# Patient Record
Sex: Male | Born: 1965 | Race: White | Hispanic: No | Marital: Married | Smoking: Former smoker
Health system: Southern US, Community
[De-identification: ages and names within clinical notes are randomized; demographics above are authoritative.]

## PROBLEM LIST (undated history)

## (undated) DIAGNOSIS — I10 Essential (primary) hypertension: Secondary | ICD-10-CM

---

## 2004-02-21 ENCOUNTER — Ambulatory Visit (HOSPITAL_COMMUNITY): Admission: RE | Admit: 2004-02-21 | Discharge: 2004-02-21 | Payer: Self-pay | Admitting: Family Medicine

## 2004-02-23 ENCOUNTER — Ambulatory Visit (HOSPITAL_COMMUNITY): Admission: RE | Admit: 2004-02-23 | Discharge: 2004-02-23 | Payer: Self-pay | Admitting: Family Medicine

## 2004-04-26 ENCOUNTER — Encounter: Admission: RE | Admit: 2004-04-26 | Discharge: 2004-04-26 | Payer: Self-pay | Admitting: Neurosurgery

## 2004-05-10 ENCOUNTER — Encounter: Admission: RE | Admit: 2004-05-10 | Discharge: 2004-05-10 | Payer: Self-pay | Admitting: Neurosurgery

## 2004-05-24 ENCOUNTER — Encounter: Admission: RE | Admit: 2004-05-24 | Discharge: 2004-05-24 | Payer: Self-pay | Admitting: Neurosurgery

## 2004-10-25 IMAGING — CR DG CHEST 2V
2 series · 2 of 2 positions shown · non-contrast
Comparison: none

CLINICAL DATA: Herniated nucleus pulposus.  Pre-admission for surgery [DATE].  Nonsmoker.  No chest complaints. 
 2-VIEW CHEST:
 PA and lateral views of the chest are made and show the heart, lungs, bony thorax, and soft tissues to be within the normal limits.

[view not recorded (1 of 2)]
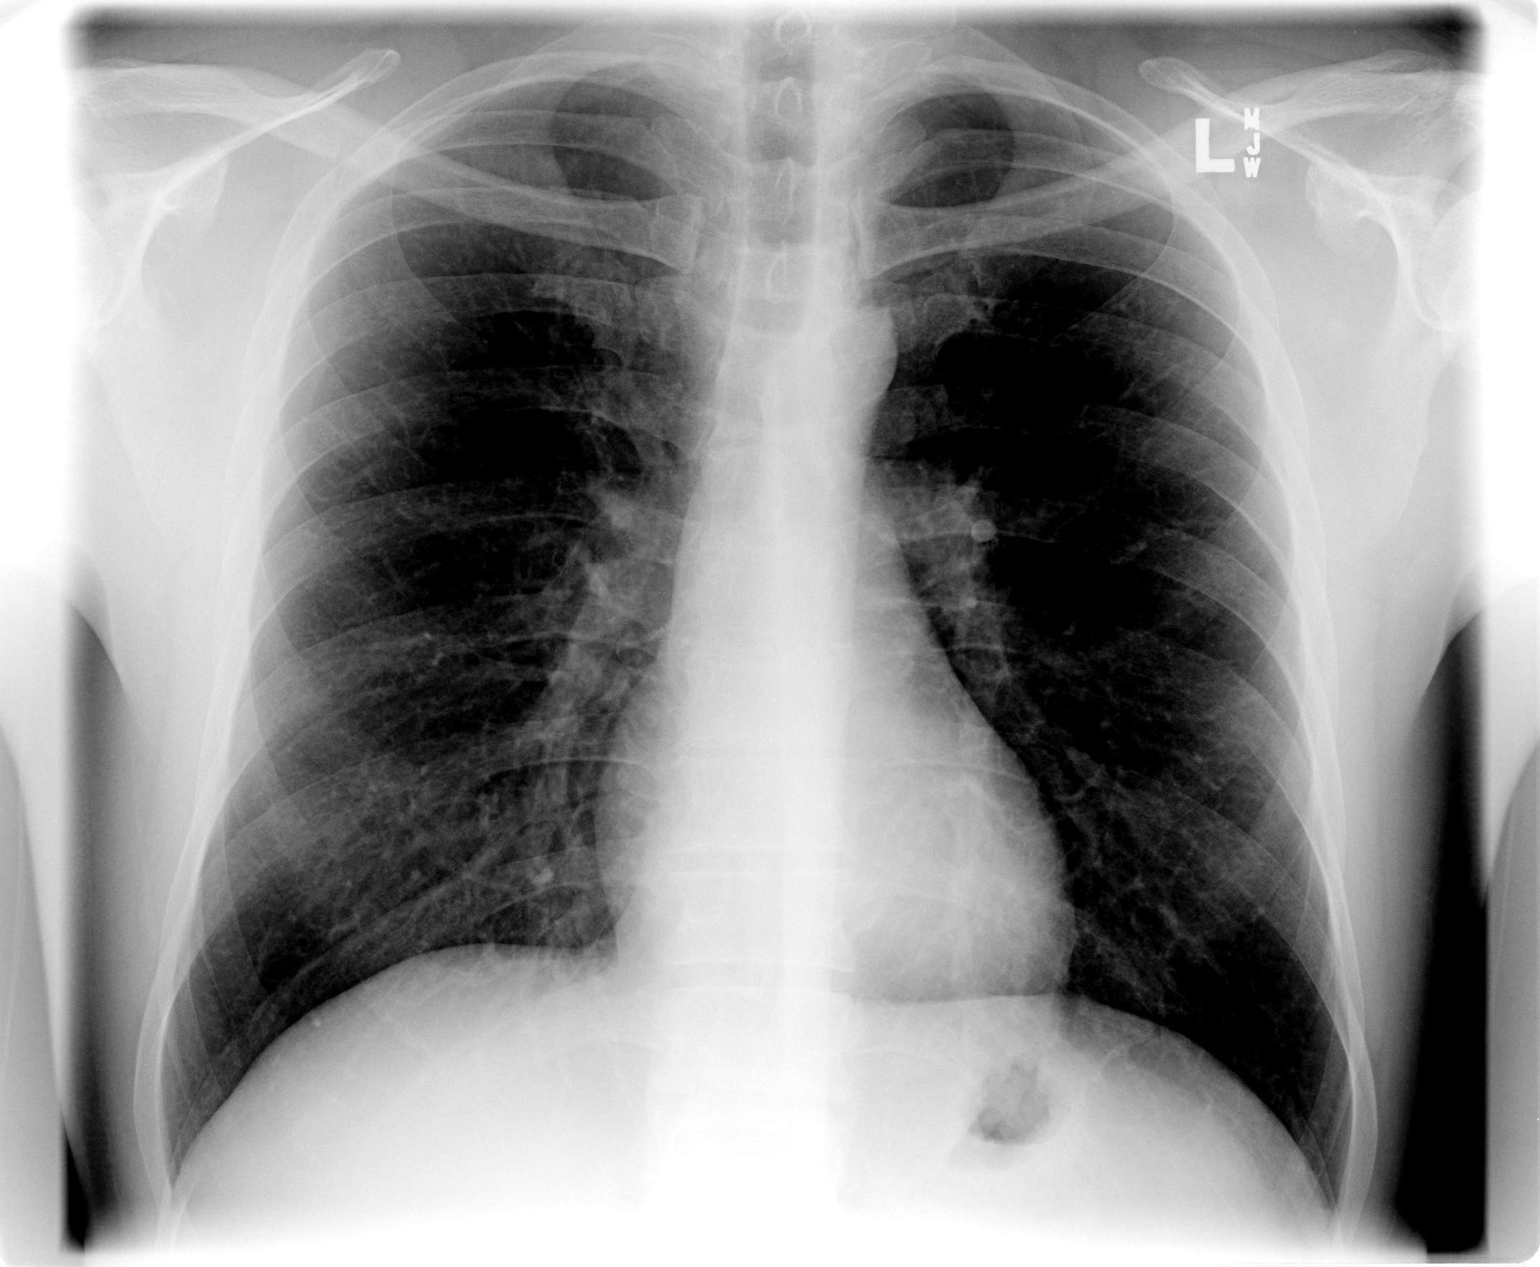

[view not recorded (2 of 2)]
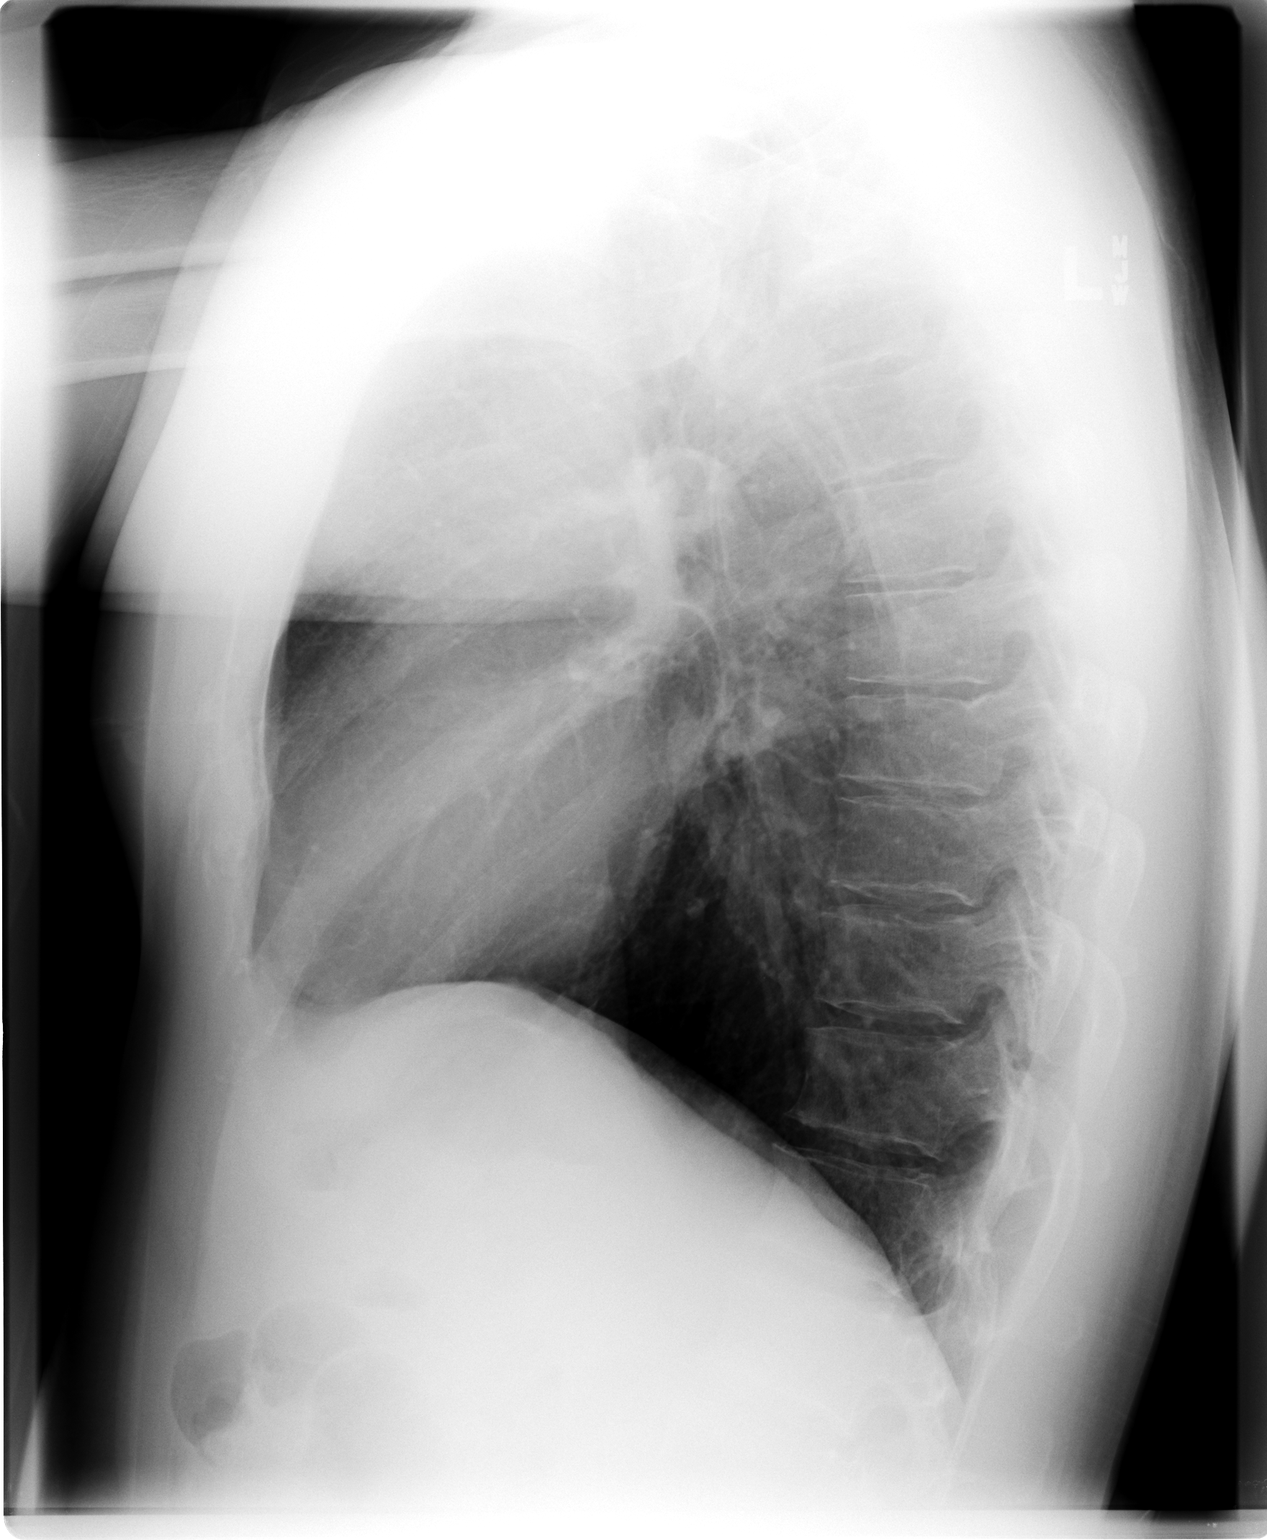

[2 of 2 positions shown; findings below may reference images not displayed]

IMPRESSION: No evidence of active cardiopulmonary disease.

## 2004-10-27 ENCOUNTER — Inpatient Hospital Stay (HOSPITAL_COMMUNITY): Admission: AD | Admit: 2004-10-27 | Discharge: 2004-10-29 | Payer: Self-pay | Admitting: Neurosurgery

## 2019-11-19 ENCOUNTER — Ambulatory Visit (INDEPENDENT_AMBULATORY_CARE_PROVIDER_SITE_OTHER): Payer: BC Managed Care – PPO | Admitting: Podiatry

## 2019-11-19 ENCOUNTER — Other Ambulatory Visit: Payer: Self-pay

## 2019-11-19 ENCOUNTER — Ambulatory Visit (INDEPENDENT_AMBULATORY_CARE_PROVIDER_SITE_OTHER): Payer: BC Managed Care – PPO

## 2019-11-19 ENCOUNTER — Encounter: Payer: Self-pay | Admitting: Podiatry

## 2019-11-19 ENCOUNTER — Other Ambulatory Visit: Payer: Self-pay | Admitting: Podiatry

## 2019-11-19 VITALS — BP 119/72 | HR 69 | Temp 97.3°F | Resp 16

## 2019-11-19 DIAGNOSIS — L600 Ingrowing nail: Secondary | ICD-10-CM

## 2019-11-19 DIAGNOSIS — M205X9 Other deformities of toe(s) (acquired), unspecified foot: Secondary | ICD-10-CM

## 2019-11-19 DIAGNOSIS — M79672 Pain in left foot: Secondary | ICD-10-CM | POA: Diagnosis not present

## 2019-11-19 DIAGNOSIS — M79671 Pain in right foot: Secondary | ICD-10-CM | POA: Diagnosis not present

## 2019-11-19 MED ORDER — NEOMYCIN-POLYMYXIN-HC 3.5-10000-1 OT SOLN: OTIC | 0 refills | Status: AC

## 2019-11-19 NOTE — Progress Notes (Signed)
   Subjective:    Patient ID: Raymond Perkins, male    DOB: 1966-03-16, 54 y.o.   MRN: [id number]  HPI    Review of Systems  All other systems reviewed and are negative.      Objective:   Physical Exam        Assessment & Plan:

## 2019-11-19 NOTE — Patient Instructions (Signed)

## 2019-11-19 NOTE — Progress Notes (Signed)
Subjective:   Patient ID: Raymond Perkins, male   DOB: 54 y.o.   MRN: [id number]   HPI Patient presents stating that the left hallux has been ingrown and sore and making it hard to wear shoe gear comfortably.  Patient also complains that the big toe joint at times she has pain and that he has noted that they have gotten larger.  Patient does not smoke likes to be active   Review of Systems  All other systems reviewed and are negative.       Objective:  Physical Exam Vitals and nursing note reviewed.  Constitutional:      Appearance: He is well-developed.  Pulmonary:     Effort: Pulmonary effort is normal.  Musculoskeletal:        General: Normal range of motion.  Skin:    General: Skin is warm.  Neurological:     Mental Status: He is alert.     Neurovascular status intact muscle strength found to be adequate range of motion within normal limits.  Patient does have limitation of motion first MPJ bilateral with enlargement around the joint surfaces and on the left hallux lateral border incurvated painful when pressed make shoe gear difficult with no redness or drainage noted.  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  I infiltrated the left hallux 60 mg Xylocaine Marcaine mixture sterile prep applied using sterile instrumentation remove the lateral border exposed matrix and applied phenol 3 applications 30 seconds followed by alcohol lavage and sterile dressing.  Give instructions on soaks and to leave dressing on 24 hours but take it off earlier as needed and prescription for drops written and patient is encouraged to call with questions concerns.  I  educated the patient on hallux limitus rigidus and we will defer treatment currently  X-rays indicate significant spurring first MPJ right over left with narrowing of the joint surface but appears that there is adequate space with a rounded joint

## 2020-08-23 ENCOUNTER — Other Ambulatory Visit: Payer: Self-pay

## 2020-08-23 ENCOUNTER — Ambulatory Visit (INDEPENDENT_AMBULATORY_CARE_PROVIDER_SITE_OTHER): Payer: BC Managed Care – PPO | Admitting: Otolaryngology

## 2020-08-23 ENCOUNTER — Encounter (INDEPENDENT_AMBULATORY_CARE_PROVIDER_SITE_OTHER): Payer: Self-pay | Admitting: Otolaryngology

## 2020-08-23 VITALS — Temp 97.7°F

## 2020-08-23 DIAGNOSIS — H9120 Sudden idiopathic hearing loss, unspecified ear: Secondary | ICD-10-CM

## 2020-08-23 DIAGNOSIS — H9042 Sensorineural hearing loss, unilateral, left ear, with unrestricted hearing on the contralateral side: Secondary | ICD-10-CM

## 2020-08-23 NOTE — Progress Notes (Signed)
HPI: Raymond Perkins is a 55 y.o. male who presents for evaluation of sudden left ear hearing loss.  Patient works as a Company secretary as well as has a history of shooting guns.  Yesterday he woke up with significant decreased hearing in the left ear.  This has improved some today.  He has an uncle of Raymond Perkins's and had a hearing test earlier today that showed downsloping SNHL in both ears with slightly worse hearing in the left ear compared to the right.  SRT on the left side was 30 dB.  SRT on the right side was 15 dB.  He had type A tympanograms bilaterally.  He has had longstanding tinnitus in both ears.  But has been worse on the left side since yesterday.  No past medical history on file. No past surgical history on file. Social History   Socioeconomic History  . Marital status: Married    Spouse name: Not on file  . Number of children: Not on file  . Years of education: Not on file  . Highest education level: Not on file  Occupational History  . Not on file  Tobacco Use  . Smoking status: Former Smoker    Types: Cigarettes    Start date: 1986    Quit date: 2005    Years since quitting: 17.0  . Smokeless tobacco: Former Neurosurgeon    Types: Snuff    Quit date: 2006  . Tobacco comment: Very light smoker  Substance and Sexual Activity  . Alcohol use: Not on file  . Drug use: Not on file  . Sexual activity: Not on file  Other Topics Concern  . Not on file  Social History Narrative  . Not on file   Social Determinants of Health   Financial Resource Strain: Not on file  Food Insecurity: Not on file  Transportation Needs: Not on file  Physical Activity: Not on file  Stress: Not on file  Social Connections: Not on file   No family history on file. No Known Allergies Prior to Admission medications   Medication Sig Start Date End Date Taking? Authorizing Provider  hydrochlorothiazide (HYDRODIURIL) 25 MG tablet Take 25 mg by mouth every morning. 08/28/19   [provider]   losartan (COZAAR) 100 MG tablet Take 100 mg by mouth daily. 08/28/19   [provider]  neomycin-polymyxin-hydrocortisone (CORTISPORIN) OTIC solution Apply 1-2 drops to toe after soaking twice a day 11/19/19   Regal, Kirstie Peri, DPM  UNABLE TO FIND CPAP Machine - every night    [provider]     Positive ROS: Otherwise negative  All other systems have been reviewed and were otherwise negative with the exception of those mentioned in the HPI and as above.  Physical Exam: Constitutional: Alert, well-appearing, no acute distress Ears: External ears without lesions or tenderness. Ear canals are clear bilaterally with intact, clear TMs bilaterally. Nasal: External nose without lesions. Clear nasal passages Oral: Lips and gums without lesions. Tongue and palate mucosa without lesions. Posterior oropharynx clear. Neck: No palpable adenopathy or masses Respiratory: Breathing comfortably  Skin: No facial/neck lesions or rash noted.  Reviewed the audiogram patient has slightly worse hearing on the pure tones in the left compared to the right but this was minimal difference.  Most pronounced at 2000 frequency with the left ear at 25 dB in the right ear at 10 dB.  Procedures  Assessment: Left ear sudden sensorineural hearing loss.  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  Plan: His hearing seems to have spontaneously improved over the past 24 hours.  I placed him on a Sterapred 10 mg 12-day Dosepak.  He will have repeat hearing in 2 weeks with Raymond Perkins.  Narda Bonds, MD

## 2020-08-24 ENCOUNTER — Encounter (INDEPENDENT_AMBULATORY_CARE_PROVIDER_SITE_OTHER): Payer: Self-pay

## 2021-07-26 DIAGNOSIS — G4733 Obstructive sleep apnea (adult) (pediatric): Secondary | ICD-10-CM | POA: Diagnosis not present

## 2021-07-28 DIAGNOSIS — G4733 Obstructive sleep apnea (adult) (pediatric): Secondary | ICD-10-CM | POA: Diagnosis not present

## 2021-08-26 DIAGNOSIS — G4733 Obstructive sleep apnea (adult) (pediatric): Secondary | ICD-10-CM | POA: Diagnosis not present

## 2021-09-23 DIAGNOSIS — G4733 Obstructive sleep apnea (adult) (pediatric): Secondary | ICD-10-CM | POA: Diagnosis not present

## 2021-10-24 DIAGNOSIS — G4733 Obstructive sleep apnea (adult) (pediatric): Secondary | ICD-10-CM | POA: Diagnosis not present

## 2021-10-26 DIAGNOSIS — G4733 Obstructive sleep apnea (adult) (pediatric): Secondary | ICD-10-CM | POA: Diagnosis not present

## 2021-11-23 DIAGNOSIS — G4733 Obstructive sleep apnea (adult) (pediatric): Secondary | ICD-10-CM | POA: Diagnosis not present

## 2021-12-24 DIAGNOSIS — G4733 Obstructive sleep apnea (adult) (pediatric): Secondary | ICD-10-CM | POA: Diagnosis not present

## 2022-01-22 DIAGNOSIS — G4733 Obstructive sleep apnea (adult) (pediatric): Secondary | ICD-10-CM | POA: Diagnosis not present

## 2022-01-25 DIAGNOSIS — G4733 Obstructive sleep apnea (adult) (pediatric): Secondary | ICD-10-CM | POA: Diagnosis not present

## 2022-02-22 DIAGNOSIS — G4733 Obstructive sleep apnea (adult) (pediatric): Secondary | ICD-10-CM | POA: Diagnosis not present

## 2022-02-27 DIAGNOSIS — H40013 Open angle with borderline findings, low risk, bilateral: Secondary | ICD-10-CM | POA: Diagnosis not present

## 2022-02-28 ENCOUNTER — Ambulatory Visit
Admission: EM | Admit: 2022-02-28 | Discharge: 2022-02-28 | Disposition: A | Discharge status: Home / Self Care | Payer: BC Managed Care – PPO

## 2022-02-28 ENCOUNTER — Ambulatory Visit: Payer: Self-pay

## 2022-02-28 DIAGNOSIS — T148XXA Other injury of unspecified body region, initial encounter: Secondary | ICD-10-CM

## 2022-02-28 DIAGNOSIS — M546 Pain in thoracic spine: Secondary | ICD-10-CM

## 2022-02-28 HISTORY — DX: Essential (primary) hypertension: I10

## 2022-02-28 MED ORDER — METHOCARBAMOL 500 MG PO TABS: 500.0000 mg | ORAL_TABLET | Freq: Two times a day (BID) | ORAL | 0 refills | Status: AC | PRN

## 2022-02-28 NOTE — Discharge Instructions (Signed)
It appears that you have a muscle strain.  Please continue Advil and Tylenol if necessary.  I have prescribed you a muscle relaxer to help alleviate discomfort as well.  Please be advised that muscle relaxer can cause drowsiness so do not drive, operate machinery, drink alcohol while taking this medication.  Also recommend applying ice.  Follow-up with orthopedist at provided contact information if pain persists or worsens.

## 2022-02-28 NOTE — ED Triage Notes (Signed)
Pt c/o feeling like there is a "hitch" in his right shoulder, or a "catch." States started last night and awoken him around midnight last night. Has gotten worse and the cramps are limiting shoulder function.   States alternating advil and tylenol q2h.

## 2022-02-28 NOTE — ED Provider Notes (Signed)
[name]    CSN: [id number] Arrival date & time: 02/28/22  0805      History   Chief Complaint Chief Complaint  Patient presents with   Shoulder Problem    HPI Raymond Perkins is a 56 y.o. male.   Patient presents with right upper back pain that started last night around midnight.  Patient reports that pain is present in the right upper back and radiates to right shoulder at times.  Patient denies any apparent injury or denies any history of chronic pain.  He denies that he has been doing any heavy lifting lately as well.  Patient has been alternating Tylenol and Advil with improvement in pain and states that pain has pretty much resolved at this point.  He states that he wants to be evaluated given that it was severe last night and this has never occurred before.  He has also been applying ice to the area.     Past Medical History:  Diagnosis Date   Hypertension     There are no problems to display for this patient.   History reviewed. No pertinent surgical history.     Home Medications    Prior to Admission medications   Medication Sig Start Date End Date Taking? Authorizing Provider  methocarbamol (ROBAXIN) 500 MG tablet Take 1 tablet (500 mg total) by mouth 2 (two) times daily as needed for muscle spasms. 02/28/22  Yes Velera Lansdale, Rolly Salter E, FNP  hydrochlorothiazide (HYDRODIURIL) 25 MG tablet Take 25 mg by mouth every morning. 08/28/19   [provider]  losartan (COZAAR) 100 MG tablet Take 100 mg by mouth daily. 08/28/19   [provider]  neomycin-polymyxin-hydrocortisone (CORTISPORIN) OTIC solution Apply 1-2 drops to toe after soaking twice a day 11/19/19   Lenn Sink, DPM  omeprazole (PRILOSEC) 40 MG capsule 1 capsule 30 minutes before morning meal    [provider]  UNABLE TO FIND CPAP Machine - every night    [provider]    Family History History reviewed. No pertinent family history.  Social  History Social History   Tobacco Use   Smoking status: Former    Types: Cigarettes    Start date: 1986    Quit date: 2005    Years since quitting: 18.6   Smokeless tobacco: Former    Types: Snuff    Quit date: 2006   Tobacco comments:    Very light smoker     Allergies   Patient has no known allergies.   Review of Systems Review of Systems Per HPI  Physical Exam Triage Vital Signs ED Triage Vitals  Enc Vitals Group     BP 02/28/22 0816 (!) 150/85     Pulse Rate 02/28/22 0816 67     Resp 02/28/22 0816 18     Temp 02/28/22 0816 98 F (36.7 C)     Temp Source 02/28/22 0816 Oral     SpO2 02/28/22 0816 98 %     Weight --      Height --      Head Circumference --      Peak Flow --      Pain Score 02/28/22 0817 0     Pain Loc --      Pain Edu? --      Excl. in [place]? --    No data found.  Updated Vital Signs BP (!) 150/85 (BP Location: Left Arm)   Pulse 67   Temp 98 F (36.7  C) (Oral)   Resp 18   SpO2 98%   Visual Acuity Right Eye Distance:   Left Eye Distance:   Bilateral Distance:    Right Eye Near:   Left Eye Near:    Bilateral Near:     Physical Exam Constitutional:      General: He is not in acute distress.    Appearance: Normal appearance. He is not toxic-appearing or diaphoretic.  HENT:     Head: Normocephalic and atraumatic.  Eyes:     Extraocular Movements: Extraocular movements intact.     Conjunctiva/sclera: Conjunctivae normal.  Pulmonary:     Effort: Pulmonary effort is normal.  Musculoskeletal:       Back:     Comments: There is no tenderness to palpation on exam.  No obvious swelling, discoloration, warmth, lacerations, abrasions noted.  Patient reports tenderness occurred at circled area on diagram last night.  No tenderness to shoulder as well.  Patient has full range of motion of shoulder.  Grip strength 5/5.  No tenderness to neck.  No direct spinal tenderness, crepitus, step-off.  Neurological:     General: No focal deficit  present.     Mental Status: He is alert and oriented to person, place, and time. Mental status is at baseline.  Psychiatric:        Mood and Affect: Mood normal.        Behavior: Behavior normal.        Thought Content: Thought content normal.        Judgment: Judgment normal.      UC Treatments / Results  Labs (all labs ordered are listed, but only abnormal results are displayed) Labs Reviewed - No data to display  EKG   Radiology No results found.  Procedures Procedures (including critical care time)  Medications Ordered in UC Medications - No data to display  Initial Impression / Assessment and Plan / UC Course  I have reviewed the triage vital signs and the nursing notes.  Pertinent labs & imaging results that were available during my care of the patient were reviewed by me and considered in my medical decision making (see chart for details).     Physical exam and the patient's history are consistent with muscle strain. Suspect muscle strain of trapezius versus infraspinatus muscles.  Given the symptoms have been responsive to NSAIDs and Tylenol, patient was encouraged to continue this regimen.  Will add muscle relaxer as well.  Advised patient that muscle relaxer can cause drowsiness and do not drive, operate machinery, drink alcohol with taking. Patient voiced understanding.   Patient to apply ice to area as well.  Advised patient to follow-up with orthopedics at provided contact information if symptoms persist or worsen.  Do not think imaging is necessary given no obvious injury.  Discussed return precautions.  Patient verbalized understanding and was agreeable with plan. Final Clinical Impressions(s) / UC Diagnoses   Final diagnoses:  Muscle strain  Acute right-sided thoracic back pain     Discharge Instructions      It appears that you have a muscle strain.  Please continue Advil and Tylenol if necessary.  I have prescribed you a muscle relaxer to help  alleviate discomfort as well.  Please be advised that muscle relaxer can cause drowsiness so do not drive, operate machinery, drink alcohol while taking this medication.  Also recommend applying ice.  Follow-up with orthopedist at provided contact information if pain persists or worsens.    ED Prescriptions  Medication Sig Dispense Auth. Provider   methocarbamol (ROBAXIN) 500 MG tablet Take 1 tablet (500 mg total) by mouth 2 (two) times daily as needed for muscle spasms. 20 tablet [place], Acie Fredrickson, [place]      PDMP not reviewed this encounter.   Gustavus Bryant, [place] 02/28/22 [phone]

## 2022-03-06 DIAGNOSIS — Z125 Encounter for screening for malignant neoplasm of prostate: Secondary | ICD-10-CM | POA: Diagnosis not present

## 2022-03-06 DIAGNOSIS — I10 Essential (primary) hypertension: Secondary | ICD-10-CM | POA: Diagnosis not present

## 2022-03-06 DIAGNOSIS — Z23 Encounter for immunization: Secondary | ICD-10-CM | POA: Diagnosis not present

## 2022-03-06 DIAGNOSIS — Z0000 Encounter for general adult medical examination without abnormal findings: Secondary | ICD-10-CM | POA: Diagnosis not present

## 2022-03-25 DIAGNOSIS — G4733 Obstructive sleep apnea (adult) (pediatric): Secondary | ICD-10-CM | POA: Diagnosis not present

## 2022-04-23 DIAGNOSIS — G4733 Obstructive sleep apnea (adult) (pediatric): Secondary | ICD-10-CM | POA: Diagnosis not present

## 2022-04-27 DIAGNOSIS — G4733 Obstructive sleep apnea (adult) (pediatric): Secondary | ICD-10-CM | POA: Diagnosis not present

## 2022-05-09 ENCOUNTER — Ambulatory Visit
Admission: EM | Admit: 2022-05-09 | Discharge: 2022-05-09 | Disposition: A | Discharge status: Home / Self Care | Payer: BC Managed Care – PPO | Attending: Physician Assistant | Admitting: Physician Assistant

## 2022-05-09 DIAGNOSIS — L0291 Cutaneous abscess, unspecified: Secondary | ICD-10-CM | POA: Diagnosis not present

## 2022-05-09 MED ORDER — DOXYCYCLINE HYCLATE 100 MG PO CAPS: 100.0000 mg | ORAL_CAPSULE | Freq: Two times a day (BID) | ORAL | 0 refills | Status: AC

## 2022-05-09 NOTE — ED Triage Notes (Signed)
Pt c/o possible finger infection states a few weeks ago he thought there was an object in his finger so he got a needle and was "digging around" says he didn't find anything. Since then the area was healing until a couple days ago pts wife is concerned it is infection.

## 2022-05-09 NOTE — ED Provider Notes (Signed)
[name]    CSN: [id number] Arrival date & time: 05/09/22  0801      History   Chief Complaint Chief Complaint  Patient presents with   finger wound    HPI Raymond Perkins is a 56 y.o. male.   Patient here today for evaluation of possible infection to left index finger.  He reports that he had an area where he thought he had a foreign body, and he had tried to get this out however has now developed more swelling and tenderness to the finger.  Wife who is also a physician assistant recommended further evaluation as she was concerned for infection.  Patient denies any fever.  He has not had any nausea or vomiting.  The history is provided by the patient.    Past Medical History:  Diagnosis Date   Hypertension     There are no problems to display for this patient.   History reviewed. No pertinent surgical history.     Home Medications    Prior to Admission medications   Medication Sig Start Date End Date Taking? Authorizing Provider  doxycycline (VIBRAMYCIN) 100 MG capsule Take 1 capsule (100 mg total) by mouth 2 (two) times daily. 05/09/22  Yes Francene Finders, PA-C  hydrochlorothiazide (HYDRODIURIL) 25 MG tablet Take 25 mg by mouth every morning. 08/28/19   [provider]  losartan (COZAAR) 100 MG tablet Take 100 mg by mouth daily. 08/28/19   [provider]  methocarbamol (ROBAXIN) 500 MG tablet Take 1 tablet (500 mg total) by mouth 2 (two) times daily as needed for muscle spasms. 02/28/22   Teodora Medici, FNP  neomycin-polymyxin-hydrocortisone (CORTISPORIN) OTIC solution Apply 1-2 drops to toe after soaking twice a day 11/19/19   Wallene Huh, DPM  omeprazole (PRILOSEC) 40 MG capsule 1 capsule 30 minutes before morning meal    [provider]  UNABLE TO FIND CPAP Machine - every night    [provider]    Family History History reviewed. No pertinent family history.  Social History Social History    Tobacco Use   Smoking status: Former    Types: Cigarettes    Start date: 1986    Quit date: 2005    Years since quitting: 18.8   Smokeless tobacco: Former    Types: Snuff    Quit date: 2006   Tobacco comments:    Very light smoker     Allergies   Patient has no known allergies.   Review of Systems Review of Systems  Constitutional:  Negative for chills and fever.  Eyes:  Negative for discharge and redness.  Gastrointestinal:  Negative for nausea and vomiting.  Skin:  Positive for color change. Negative for wound.  Neurological:  Negative for numbness.     Physical Exam Triage Vital Signs ED Triage Vitals  Enc Vitals Group     BP      Pulse      Resp      Temp      Temp src      SpO2      Weight      Height      Head Circumference      Peak Flow      Pain Score      Pain Loc      Pain Edu?      Excl. in [place]?    No data found.  Updated Vital Signs BP 132/84 (BP Location: Left Arm)  Pulse 78   Temp 98.6 F (37 C) (Oral)   Resp 16   SpO2 97%     Physical Exam Vitals and nursing note reviewed.  Constitutional:      General: He is not in acute distress.    Appearance: Normal appearance. He is not ill-appearing.  HENT:     Head: Normocephalic and atraumatic.  Eyes:     Conjunctiva/sclera: Conjunctivae normal.  Cardiovascular:     Rate and Rhythm: Normal rate.  Pulmonary:     Effort: Pulmonary effort is normal. No respiratory distress.  Skin:    Comments: Diffuse swelling to left index finger distally with associated erythema- worse surrounding area of callus from prior suspected FB, no active bleeding or drainage.  Neurological:     Mental Status: He is alert.  Psychiatric:        Mood and Affect: Mood normal.        Behavior: Behavior normal.        Thought Content: Thought content normal.      UC Treatments / Results  Labs (all labs ordered are listed, but only abnormal results are displayed) Labs Reviewed - No data to  display  EKG   Radiology No results found.  Procedures Procedures (including critical care time)  Medications Ordered in UC Medications - No data to display  Initial Impression / Assessment and Plan / UC Course  I have reviewed the triage vital signs and the nursing notes.  Pertinent labs & imaging results that were available during my care of the patient were reviewed by me and considered in my medical decision making (see chart for details).    Doxycycline prescribed to cover probable infection.  Recommended follow-up if no gradual improvement or with any further concerns.  Final Clinical Impressions(s) / UC Diagnoses   Final diagnoses:  Abscess   Discharge Instructions   None    ED Prescriptions     Medication Sig Dispense Auth. Provider   doxycycline (VIBRAMYCIN) 100 MG capsule Take 1 capsule (100 mg total) by mouth 2 (two) times daily. 20 capsule Tomi Bamberger, PA-C      PDMP not reviewed this encounter.   Tomi Bamberger, PA-C 05/09/22 [phone]

## 2022-05-24 DIAGNOSIS — G4733 Obstructive sleep apnea (adult) (pediatric): Secondary | ICD-10-CM | POA: Diagnosis not present

## 2022-06-06 DIAGNOSIS — G4733 Obstructive sleep apnea (adult) (pediatric): Secondary | ICD-10-CM | POA: Diagnosis not present

## 2022-06-23 DIAGNOSIS — G4733 Obstructive sleep apnea (adult) (pediatric): Secondary | ICD-10-CM | POA: Diagnosis not present

## 2022-07-25 DIAGNOSIS — G4733 Obstructive sleep apnea (adult) (pediatric): Secondary | ICD-10-CM | POA: Diagnosis not present

## 2022-07-28 DIAGNOSIS — G4733 Obstructive sleep apnea (adult) (pediatric): Secondary | ICD-10-CM | POA: Diagnosis not present

## 2022-08-25 DIAGNOSIS — G4733 Obstructive sleep apnea (adult) (pediatric): Secondary | ICD-10-CM | POA: Diagnosis not present

## 2022-09-23 DIAGNOSIS — G4733 Obstructive sleep apnea (adult) (pediatric): Secondary | ICD-10-CM | POA: Diagnosis not present

## 2022-10-08 DIAGNOSIS — H903 Sensorineural hearing loss, bilateral: Secondary | ICD-10-CM | POA: Diagnosis not present

## 2022-10-08 DIAGNOSIS — H9313 Tinnitus, bilateral: Secondary | ICD-10-CM | POA: Insufficient documentation

## 2022-11-13 DIAGNOSIS — H903 Sensorineural hearing loss, bilateral: Secondary | ICD-10-CM | POA: Diagnosis not present

## 2023-02-15 ENCOUNTER — Ambulatory Visit
Admission: RE | Admit: 2023-02-15 | Discharge: 2023-02-15 | Disposition: A | Discharge status: Home / Self Care | Payer: BC Managed Care – PPO | Source: Ambulatory Visit

## 2023-02-15 VITALS — BP 142/90 | HR 68 | Temp 98.2°F | Resp 16 | Ht 73.0 in | Wt 204.0 lb

## 2023-02-15 DIAGNOSIS — R053 Chronic cough: Secondary | ICD-10-CM | POA: Diagnosis not present

## 2023-02-15 DIAGNOSIS — J209 Acute bronchitis, unspecified: Secondary | ICD-10-CM

## 2023-02-15 MED ORDER — BENZONATATE 100 MG PO CAPS: 100.0000 mg | ORAL_CAPSULE | Freq: Three times a day (TID) | ORAL | 0 refills | Status: AC | PRN

## 2023-02-15 MED ORDER — PREDNISONE 20 MG PO TABS: 40.0000 mg | ORAL_TABLET | Freq: Every day | ORAL | 0 refills | Status: AC

## 2023-02-15 MED ORDER — AZITHROMYCIN 250 MG PO TABS: ORAL_TABLET | ORAL | 0 refills | Status: AC

## 2023-02-15 NOTE — ED Triage Notes (Signed)
Pt states cough and chest congestion for the past 2 weeks.

## 2023-02-15 NOTE — ED Provider Notes (Signed)
[name]    CSN: [id number] Arrival date & time: 02/15/23  0920      History   Chief Complaint Chief Complaint  Patient presents with   Nasal Congestion    Congestion in lungs coughing - Entered by patient   Cough    HPI Raymond Perkins is a 57 y.o. male.   Patient presents with cough and feelings of chest congestion that have been present for about 2 weeks.  He denies any associated upper respiratory symptoms including nasal congestion or runny nose.  Denies any fever or known sick contacts.  Reports that symptoms started when he was recently traveling in [place].  Denies chest pain or shortness of breath.  He has taken DayQuil for symptoms with minimal improvement.  Denies history of asthma or COPD.  Reports that he was a former smoker but does not currently smoke cigarettes.   Cough   Past Medical History:  Diagnosis Date   Hypertension     There are no problems to display for this patient.   History reviewed. No pertinent surgical history.     Home Medications    Prior to Admission medications   Medication Sig Start Date End Date Taking? Authorizing Provider  azithromycin (ZITHROMAX Z-PAK) 250 MG tablet Take 2 tablets (500 mg total) by mouth daily for 1 day, THEN 1 tablet (250 mg total) daily for 4 days. 02/15/23 02/20/23 Yes Raahil Ong, Acie Fredrickson, FNP  benzonatate (TESSALON) 100 MG capsule Take 1 capsule (100 mg total) by mouth every 8 (eight) hours as needed for cough. 02/15/23  Yes Florene Brill, Rolly Salter E, FNP  predniSONE (DELTASONE) 20 MG tablet Take 2 tablets (40 mg total) by mouth daily for 5 days. 02/15/23 02/20/23 Yes Tareq Dwan, Acie Fredrickson, FNP  sildenafil (VIAGRA) 100 MG tablet Take by mouth. 10/02/22  Yes [provider]  doxycycline (VIBRAMYCIN) 100 MG capsule Take 1 capsule (100 mg total) by mouth 2 (two) times daily. 05/09/22   Tomi Bamberger, PA-C  hydrochlorothiazide (HYDRODIURIL) 25 MG tablet Take 25 mg by mouth every morning. 08/28/19    [provider]  losartan (COZAAR) 100 MG tablet Take 100 mg by mouth daily. 08/28/19   [provider]  methocarbamol (ROBAXIN) 500 MG tablet Take 1 tablet (500 mg total) by mouth 2 (two) times daily as needed for muscle spasms. 02/28/22   Gustavus Bryant, FNP  neomycin-polymyxin-hydrocortisone (CORTISPORIN) OTIC solution Apply 1-2 drops to toe after soaking twice a day 11/19/19   Lenn Sink, DPM  omeprazole (PRILOSEC) 40 MG capsule 1 capsule 30 minutes before morning meal    [provider]  UNABLE TO FIND CPAP Machine - every night    [provider]    Family History History reviewed. No pertinent family history.  Social History Social History   Tobacco Use   Smoking status: Former    Current packs/day: 0.00    Types: Cigarettes    Start date: 48    Quit date: 2005    Years since quitting: 19.5   Smokeless tobacco: Former    Types: Snuff    Quit date: 2006   Tobacco comments:    Very light smoker     Allergies   Patient has no known allergies.   Review of Systems Review of Systems Per HPI  Physical Exam Triage Vital Signs ED Triage Vitals  Encounter Vitals Group     BP 02/15/23 0931 (!) 142/90     Systolic BP Percentile --  Diastolic BP Percentile --      Pulse Rate 02/15/23 0931 68     Resp 02/15/23 0931 16     Temp 02/15/23 0931 98.2 F (36.8 C)     Temp Source 02/15/23 0931 Oral     SpO2 02/15/23 0931 97 %     Weight 02/15/23 0932 204 lb (92.5 kg)     Height 02/15/23 0932 6\' 1"  (1.854 m)     Head Circumference --      Peak Flow --      Pain Score 02/15/23 0932 0     Pain Loc --      Pain Education --      Exclude from Growth Chart --    No data found.  Updated Vital Signs BP (!) 142/90 (BP Location: Left Arm)   Pulse 68   Temp 98.2 F (36.8 C) (Oral)   Resp 16   Ht 6\' 1"  (1.854 m)   Wt 204 lb (92.5 kg)   SpO2 97%   BMI 26.91 kg/m   Visual Acuity Right Eye Distance:   Left Eye Distance:    Bilateral Distance:    Right Eye Near:   Left Eye Near:    Bilateral Near:     Physical Exam Constitutional:      General: He is not in acute distress.    Appearance: Normal appearance. He is not toxic-appearing or diaphoretic.  HENT:     Head: Normocephalic and atraumatic.     Right Ear: Tympanic membrane and ear canal normal.     Left Ear: Tympanic membrane and ear canal normal.     Nose: No congestion.     Mouth/Throat:     Mouth: Mucous membranes are moist.     Pharynx: No posterior oropharyngeal erythema.  Eyes:     Extraocular Movements: Extraocular movements intact.     Conjunctiva/sclera: Conjunctivae normal.     Pupils: Pupils are equal, round, and reactive to light.  Cardiovascular:     Rate and Rhythm: Normal rate and regular rhythm.     Pulses: Normal pulses.     Heart sounds: Normal heart sounds.  Pulmonary:     Effort: Pulmonary effort is normal. No respiratory distress.     Breath sounds: Normal breath sounds. No stridor. No wheezing, rhonchi or rales.  Abdominal:     General: Abdomen is flat. Bowel sounds are normal.     Palpations: Abdomen is soft.  Musculoskeletal:        General: Normal range of motion.     Cervical back: Normal range of motion.  Skin:    General: Skin is warm and dry.  Neurological:     General: No focal deficit present.     Mental Status: He is alert and oriented to person, place, and time. Mental status is at baseline.  Psychiatric:        Mood and Affect: Mood normal.        Behavior: Behavior normal.      UC Treatments / Results  Labs (all labs ordered are listed, but only abnormal results are displayed) Labs Reviewed - No data to display  EKG   Radiology No results found.  Procedures Procedures (including critical care time)  Medications Ordered in UC Medications - No data to display  Initial Impression / Assessment and Plan / UC Course  I have reviewed the triage vital signs and the nursing  notes.  Pertinent labs & imaging results that were available during my  care of the patient were reviewed by me and considered in my medical decision making (see chart for details).     Suspect possible bronchitis.  There are no adventitious lung sounds on exam and oxygen is normal so do not think that chest imaging is necessary.  Will prescribe azithromycin to cover for any atypicals and prednisone steroid burst to help alleviate inflammation in chest.  Benzonatate prescribed to take as needed for cough.  Advised patient to follow-up if any symptoms persist or worsen.  Viral testing deferred given duration of symptoms as it would not change treatment.  Patient verbalized understanding and was agreeable with plan. Final Clinical Impressions(s) / UC Diagnoses   Final diagnoses:  Acute bronchitis, unspecified organism  Persistent cough     Discharge Instructions      Suspect that you may have some form of bronchitis.  I have prescribed you 3 medications to alleviate symptoms.  Please follow-up if any symptoms persist or worsen.    ED Prescriptions     Medication Sig Dispense Auth. Provider   azithromycin (ZITHROMAX Z-PAK) 250 MG tablet Take 2 tablets (500 mg total) by mouth daily for 1 day, THEN 1 tablet (250 mg total) daily for 4 days. 6 tablet [place], [place] E, [place]   predniSONE (DELTASONE) 20 MG tablet Take 2 tablets (40 mg total) by mouth daily for 5 days. 10 tablet [place], [place] E, [place]   benzonatate (TESSALON) 100 MG capsule Take 1 capsule (100 mg total) by mouth every 8 (eight) hours as needed for cough. 21 capsule [place], Acie Fredrickson, [place]      PDMP not reviewed this encounter.   Gustavus Bryant, [place] 02/15/23 [phone]

## 2023-02-15 NOTE — Discharge Instructions (Signed)
Suspect that you may have some form of bronchitis.  I have prescribed you 3 medications to alleviate symptoms.  Please follow-up if any symptoms persist or worsen.

## 2023-02-21 DIAGNOSIS — G4733 Obstructive sleep apnea (adult) (pediatric): Secondary | ICD-10-CM | POA: Diagnosis not present

## 2023-03-15 DIAGNOSIS — Z0000 Encounter for general adult medical examination without abnormal findings: Secondary | ICD-10-CM | POA: Diagnosis not present

## 2023-03-24 DIAGNOSIS — G4733 Obstructive sleep apnea (adult) (pediatric): Secondary | ICD-10-CM | POA: Diagnosis not present

## 2023-04-23 DIAGNOSIS — G4733 Obstructive sleep apnea (adult) (pediatric): Secondary | ICD-10-CM | POA: Diagnosis not present

## 2023-05-23 DIAGNOSIS — X32XXXA Exposure to sunlight, initial encounter: Secondary | ICD-10-CM | POA: Diagnosis not present

## 2023-05-23 DIAGNOSIS — L570 Actinic keratosis: Secondary | ICD-10-CM | POA: Diagnosis not present

## 2023-05-24 DIAGNOSIS — G4733 Obstructive sleep apnea (adult) (pediatric): Secondary | ICD-10-CM | POA: Diagnosis not present

## 2023-06-12 DIAGNOSIS — G4733 Obstructive sleep apnea (adult) (pediatric): Secondary | ICD-10-CM | POA: Diagnosis not present

## 2023-06-23 DIAGNOSIS — G4733 Obstructive sleep apnea (adult) (pediatric): Secondary | ICD-10-CM | POA: Diagnosis not present

## 2024-06-16 ENCOUNTER — Encounter: Payer: Self-pay | Admitting: Emergency Medicine

## 2024-06-16 ENCOUNTER — Ambulatory Visit: Admission: EM | Admit: 2024-06-16 | Discharge: 2024-06-16 | Disposition: A | Discharge status: Home / Self Care

## 2024-06-16 DIAGNOSIS — K200 Eosinophilic esophagitis: Secondary | ICD-10-CM | POA: Insufficient documentation

## 2024-06-16 DIAGNOSIS — R1310 Dysphagia, unspecified: Secondary | ICD-10-CM | POA: Insufficient documentation

## 2024-06-16 DIAGNOSIS — G4733 Obstructive sleep apnea (adult) (pediatric): Secondary | ICD-10-CM | POA: Insufficient documentation

## 2024-06-16 DIAGNOSIS — I10 Essential (primary) hypertension: Secondary | ICD-10-CM | POA: Insufficient documentation

## 2024-06-16 DIAGNOSIS — S81811A Laceration without foreign body, right lower leg, initial encounter: Secondary | ICD-10-CM

## 2024-06-16 DIAGNOSIS — K219 Gastro-esophageal reflux disease without esophagitis: Secondary | ICD-10-CM | POA: Insufficient documentation

## 2024-06-16 DIAGNOSIS — D696 Thrombocytopenia, unspecified: Secondary | ICD-10-CM | POA: Insufficient documentation

## 2024-06-16 DIAGNOSIS — N529 Male erectile dysfunction, unspecified: Secondary | ICD-10-CM | POA: Insufficient documentation

## 2024-06-16 DIAGNOSIS — R7989 Other specified abnormal findings of blood chemistry: Secondary | ICD-10-CM | POA: Insufficient documentation

## 2024-06-16 NOTE — ED Notes (Signed)
 jl

## 2024-06-16 NOTE — ED Triage Notes (Signed)
 Pt presents with laceration located on L right leg beneath knee. Pt states,  Around 11 am this morning a I was cutting a pice piece of corrugated pipe towards me instead of away and somehow the blade slipped and cut through my jeans and nipped my leg. It was bleeding bad this morning but has since then slowed down a lot. My wife made me come here. She said I need stitches. Wound appears clean and not actively bleeding.   Last Tdap 03/06/22.

## 2024-06-16 NOTE — ED Provider Notes (Signed)
 [name]    CSN: [id number] Arrival date & time: 06/16/24  1817      History   Chief Complaint Chief Complaint  Patient presents with   Laceration    R leg     HPI Raymond Perkins is a 58 y.o. male.   Patient presents today due to 3 cm laceration of right leg just below right knee. Pt states that he accidentally cut himself through his pants when he was trying to cut a piece of metal with a knife.  Patient states he is up-to-date with his tetanus immunization, has had it within the last 5 years.  Bleeding is well-controlled.  The history is provided by the patient.  Laceration   Past Medical History:  Diagnosis Date   Hypertension     Patient Active Problem List   Diagnosis Date Noted   Abnormal serum thyroid stimulating hormone (TSH) level 06/16/2024   Dysphagia 06/16/2024   Eosinophilic esophagitis 06/16/2024   Erectile dysfunction 06/16/2024   Essential hypertension 06/16/2024   Gastroesophageal reflux disease 06/16/2024   Obstructive sleep apnea syndrome 06/16/2024   Thrombocytopenia 06/16/2024   Sensorineural hearing loss (SNHL) of both ears 10/08/2022   Tinnitus of both ears 10/08/2022    History reviewed. No pertinent surgical history.     Home Medications    Prior to Admission medications   Medication Sig Start Date End Date Taking? Authorizing Provider  ascorbic acid (VITAMIN C) 250 MG tablet 1 tablet Orally Once a day   Yes [provider]  losartan (COZAAR) 100 MG tablet Take 100 mg by mouth daily. 08/28/19  Yes [provider]  Multiple Vitamin (MULTI VITAMIN) TABS 1 tablet Orally Once a day   Yes [provider]  omeprazole (PRILOSEC) 40 MG capsule 1 capsule 30 minutes before morning meal   Yes [provider]  benzonatate  (TESSALON ) 100 MG capsule Take 1 capsule (100 mg total) by mouth every 8 (eight) hours as needed for cough. 02/15/23   Hazen Darryle BRAVO, FNP  doxycycline  (VIBRAMYCIN ) 100 MG  capsule Take 1 capsule (100 mg total) by mouth 2 (two) times daily. 05/09/22   Billy Asberry FALCON, PA-C  hydrochlorothiazide (HYDRODIURIL) 25 MG tablet Take 25 mg by mouth every morning. 08/28/19   [provider]  methocarbamol  (ROBAXIN ) 500 MG tablet Take 1 tablet (500 mg total) by mouth 2 (two) times daily as needed for muscle spasms. 02/28/22   Hazen Darryle BRAVO, FNP  neomycin -polymyxin-hydrocortisone (CORTISPORIN) OTIC solution Apply 1-2 drops to toe after soaking twice a day 11/19/19   Magdalen Pasco RAMAN, DPM  sildenafil (VIAGRA) 100 MG tablet Take by mouth. 10/02/22   [provider]  UNABLE TO FIND CPAP Machine - every night    [provider]    Family History History reviewed. No pertinent family history.  Social History Social History   Tobacco Use   Smoking status: Former    Current packs/day: 0.00    Types: Cigarettes    Start date: 74    Quit date: 2005    Years since quitting: 20.9    Passive exposure: Past   Smokeless tobacco: Former    Types: Snuff    Quit date: 2006   Tobacco comments:    Very light smoker  Vaping Use   Vaping status: Never Used  Substance Use Topics   Alcohol use: Yes    Comment: Occassionally   Drug use: Never     Allergies   Patient has no  known allergies.   Review of Systems Review of Systems   Physical Exam Triage Vital Signs ED Triage Vitals  Encounter Vitals Group     BP 06/16/24 1850 (!) 155/81     Girls Systolic BP Percentile --      Girls Diastolic BP Percentile --      Boys Systolic BP Percentile --      Boys Diastolic BP Percentile --      Pulse Rate 06/16/24 1850 81     Resp 06/16/24 1850 18     Temp 06/16/24 1850 98.2 F (36.8 C)     Temp Source 06/16/24 1850 Oral     SpO2 06/16/24 1850 97 %     Weight 06/16/24 1849 220 lb (99.8 kg)     Height --      Head Circumference --      Peak Flow --      Pain Score 06/16/24 1849 0     Pain Loc --      Pain Education --      Exclude from Growth  Chart --    No data found.  Updated Vital Signs BP (!) 155/81 (BP Location: Left Arm)   Pulse 81   Temp 98.2 F (36.8 C) (Oral)   Resp 18   Wt 220 lb (99.8 kg)   SpO2 97%   BMI 29.03 kg/m   Visual Acuity Right Eye Distance:   Left Eye Distance:   Bilateral Distance:    Right Eye Near:   Left Eye Near:    Bilateral Near:     Physical Exam Vitals and nursing note reviewed.  Constitutional:      General: He is not in acute distress.    Appearance: Normal appearance. He is not ill-appearing, toxic-appearing or diaphoretic.  Eyes:     General: No scleral icterus. Cardiovascular:     Rate and Rhythm: Normal rate and regular rhythm.     Heart sounds: Normal heart sounds.  Pulmonary:     Effort: Pulmonary effort is normal. No respiratory distress.     Breath sounds: Normal breath sounds. No wheezing or rhonchi.  Musculoskeletal:       Legs:     Comments: 3 cm laceration of right lower leg where indicated on diagram.  Skin:    General: Skin is warm.  Neurological:     Mental Status: He is alert and oriented to person, place, and time.  Psychiatric:        Mood and Affect: Mood normal.        Behavior: Behavior normal.      UC Treatments / Results  Labs (all labs ordered are listed, but only abnormal results are displayed) Labs Reviewed - No data to display  EKG   Radiology No results found.  Procedures Laceration Repair  Date/Time: 06/16/2024 7:29 PM  Performed by: Andra Corean BROCKS, PA-C Authorized by: Andra Corean BROCKS, PA-C   Consent:    Consent obtained:  Verbal   Consent given by:  Patient   Risks discussed:  Pain   Alternatives discussed:  No treatment Universal protocol:    Procedure explained and questions answered to patient or proxy's satisfaction: yes     Patient identity confirmed:  Verbally with patient Anesthesia:    Anesthesia method:  Local infiltration   Local anesthetic:  Lidocaine 1% w/o epi Laceration details:     Location:  Leg   Leg location:  R lower leg   Length (cm):  3 Exploration:  Limited defect created (wound extended): no     Contaminated: no   Treatment:    Amount of cleaning:  Standard   Debridement:  None Skin repair:    Repair method:  Sutures   Suture size:  4-0   Suture material:  Nylon   Suture technique:  Simple interrupted   Number of sutures:  3 Approximation:    Approximation:  Close Repair type:    Repair type:  Simple Post-procedure details:    Dressing:  Sterile dressing   Procedure completion:  Tolerated well, no immediate complications  (including critical care time)  Medications Ordered in UC Medications - No data to display  Initial Impression / Assessment and Plan / UC Course  I have reviewed the triage vital signs and the nursing notes.  Pertinent labs & imaging results that were available during my care of the patient were reviewed by me and considered in my medical decision making (see chart for details).    Final Clinical Impressions(s) / UC Diagnoses   Final diagnoses:  Laceration of right lower extremity excluding thigh, initial encounter     Discharge Instructions      Keep area clean and dry. Please do not put excessive stress or strain of skin or joint around the laceration Please return in 10 days for removal of sutures.    ED Prescriptions   None    PDMP not reviewed this encounter.   Andra Corean BROCKS, PA-C 06/16/24 1930

## 2024-06-16 NOTE — Discharge Instructions (Signed)
 Keep area clean and dry. Please do not put excessive stress or strain of skin or joint around the laceration Please return in 10 days for removal of sutures.
